# Patient Record
Sex: Female | Born: 1950
Health system: Southern US, Community
[De-identification: ages and names within clinical notes are randomized; demographics above are authoritative.]

## PROBLEM LIST (undated history)

## (undated) DIAGNOSIS — F419 Anxiety disorder, unspecified: Secondary | ICD-10-CM

## (undated) DIAGNOSIS — N3941 Urge incontinence: Secondary | ICD-10-CM

## (undated) DIAGNOSIS — M545 Low back pain, unspecified: Secondary | ICD-10-CM

## (undated) DIAGNOSIS — E7800 Pure hypercholesterolemia, unspecified: Secondary | ICD-10-CM

## (undated) DIAGNOSIS — F32A Depression, unspecified: Secondary | ICD-10-CM

## (undated) DIAGNOSIS — F329 Major depressive disorder, single episode, unspecified: Secondary | ICD-10-CM

## (undated) DIAGNOSIS — K649 Unspecified hemorrhoids: Secondary | ICD-10-CM

## (undated) DIAGNOSIS — L900 Lichen sclerosus et atrophicus: Principal | ICD-10-CM

## (undated) HISTORY — DX: Unspecified hemorrhoids: K64.9

## (undated) HISTORY — DX: Urge incontinence: N39.41

## (undated) HISTORY — DX: Low back pain, unspecified: M54.50

## (undated) HISTORY — PX: TONSILLECTOMY: SUR1361

## (undated) HISTORY — DX: Lichen sclerosus et atrophicus: L90.0

## (undated) HISTORY — DX: Low back pain: M54.5

---

## 1968-04-29 HISTORY — PX: BREAST CYST EXCISION: SHX579

## 1973-04-29 HISTORY — PX: OTHER SURGICAL HISTORY: SHX169

## 1998-04-29 HISTORY — PX: OTHER SURGICAL HISTORY: SHX169

## 1999-04-09 ENCOUNTER — Other Ambulatory Visit: Admission: RE | Admit: 1999-04-09 | Discharge: 1999-04-09 | Payer: Self-pay | Admitting: Obstetrics & Gynecology

## 1999-04-10 ENCOUNTER — Other Ambulatory Visit: Admission: RE | Admit: 1999-04-10 | Discharge: 1999-04-10 | Payer: Self-pay | Admitting: Obstetrics & Gynecology

## 1999-04-10 ENCOUNTER — Encounter (INDEPENDENT_AMBULATORY_CARE_PROVIDER_SITE_OTHER): Payer: Self-pay | Admitting: Specialist

## 1999-04-16 ENCOUNTER — Other Ambulatory Visit: Admission: RE | Admit: 1999-04-16 | Discharge: 1999-04-16 | Payer: Self-pay | Admitting: Obstetrics & Gynecology

## 1999-04-16 ENCOUNTER — Encounter (INDEPENDENT_AMBULATORY_CARE_PROVIDER_SITE_OTHER): Payer: Self-pay | Admitting: Specialist

## 1999-05-22 ENCOUNTER — Encounter (INDEPENDENT_AMBULATORY_CARE_PROVIDER_SITE_OTHER): Payer: Self-pay | Admitting: Specialist

## 1999-05-22 ENCOUNTER — Ambulatory Visit (HOSPITAL_COMMUNITY): Admission: RE | Admit: 1999-05-22 | Discharge: 1999-05-22 | Payer: Self-pay | Admitting: Obstetrics & Gynecology

## 2000-04-16 ENCOUNTER — Other Ambulatory Visit: Admission: RE | Admit: 2000-04-16 | Discharge: 2000-04-16 | Payer: Self-pay | Admitting: Obstetrics & Gynecology

## 2004-12-11 ENCOUNTER — Ambulatory Visit: Payer: Self-pay | Admitting: Internal Medicine

## 2005-12-04 ENCOUNTER — Ambulatory Visit: Payer: Self-pay | Admitting: Internal Medicine

## 2005-12-09 ENCOUNTER — Ambulatory Visit: Payer: Self-pay | Admitting: Internal Medicine

## 2005-12-18 ENCOUNTER — Ambulatory Visit: Payer: Self-pay | Admitting: Internal Medicine

## 2006-12-08 ENCOUNTER — Ambulatory Visit: Payer: Self-pay | Admitting: Internal Medicine

## 2006-12-08 LAB — CONVERTED CEMR LAB
ALT: 13 units/L (ref 0–35)
AST: 19 units/L (ref 0–37)
Alkaline Phosphatase: 89 units/L (ref 39–117)
BUN: 6 mg/dL (ref 6–23)
Basophils Relative: 0.6 % (ref 0.0–1.0)
Bilirubin Urine: NEGATIVE
Bilirubin, Direct: 0.1 mg/dL (ref 0.0–0.3)
CO2: 31 meq/L (ref 19–32)
Calcium: 8.9 mg/dL (ref 8.4–10.5)
Chloride: 106 meq/L (ref 96–112)
Eosinophils Relative: 2.1 % (ref 0.0–5.0)
GFR calc Af Amer: 133 mL/min
Glucose, Bld: 92 mg/dL (ref 70–99)
Ketones, ur: NEGATIVE mg/dL
LDL Cholesterol: 120 mg/dL — ABNORMAL HIGH (ref 0–99)
Monocytes Relative: 7.2 % (ref 3.0–11.0)
Nitrite: NEGATIVE
Platelets: 254 10*3/uL (ref 150–400)
RBC: 4.57 M/uL (ref 3.87–5.11)
RDW: 13.4 % (ref 11.5–14.6)
Specific Gravity, Urine: 1.02 (ref 1.000–1.03)
Total CHOL/HDL Ratio: 4.8
Total Protein, Urine: NEGATIVE mg/dL
Total Protein: 6.6 g/dL (ref 6.0–8.3)
Triglycerides: 115 mg/dL (ref 0–149)
Urine Glucose: NEGATIVE mg/dL
VLDL: 23 mg/dL (ref 0–40)
WBC: 6.3 10*3/uL (ref 4.5–10.5)
pH: 6 (ref 5.0–8.0)

## 2006-12-15 ENCOUNTER — Ambulatory Visit: Payer: Self-pay | Admitting: Internal Medicine

## 2006-12-15 DIAGNOSIS — E739 Lactose intolerance, unspecified: Secondary | ICD-10-CM | POA: Insufficient documentation

## 2006-12-15 DIAGNOSIS — I10 Essential (primary) hypertension: Secondary | ICD-10-CM | POA: Insufficient documentation

## 2006-12-15 DIAGNOSIS — F3289 Other specified depressive episodes: Secondary | ICD-10-CM | POA: Insufficient documentation

## 2006-12-15 DIAGNOSIS — F411 Generalized anxiety disorder: Secondary | ICD-10-CM | POA: Insufficient documentation

## 2006-12-15 DIAGNOSIS — E785 Hyperlipidemia, unspecified: Secondary | ICD-10-CM | POA: Insufficient documentation

## 2006-12-15 DIAGNOSIS — F329 Major depressive disorder, single episode, unspecified: Secondary | ICD-10-CM

## 2006-12-15 DIAGNOSIS — D6489 Other specified anemias: Secondary | ICD-10-CM | POA: Insufficient documentation

## 2006-12-16 ENCOUNTER — Ambulatory Visit: Payer: Self-pay | Admitting: Internal Medicine

## 2007-06-23 ENCOUNTER — Encounter: Payer: Self-pay | Admitting: Internal Medicine

## 2008-01-07 ENCOUNTER — Ambulatory Visit: Payer: Self-pay | Admitting: Internal Medicine

## 2008-01-07 DIAGNOSIS — J309 Allergic rhinitis, unspecified: Secondary | ICD-10-CM | POA: Insufficient documentation

## 2008-01-07 DIAGNOSIS — M810 Age-related osteoporosis without current pathological fracture: Secondary | ICD-10-CM | POA: Insufficient documentation

## 2008-01-07 LAB — CONVERTED CEMR LAB
ALT: 15 units/L (ref 0–35)
Alkaline Phosphatase: 72 units/L (ref 39–117)
Basophils Absolute: 0.1 10*3/uL (ref 0.0–0.1)
Bilirubin Urine: NEGATIVE
Bilirubin, Direct: 0.2 mg/dL (ref 0.0–0.3)
CO2: 30 meq/L (ref 19–32)
Calcium: 9.3 mg/dL (ref 8.4–10.5)
Chloride: 109 meq/L (ref 96–112)
HDL: 37.1 mg/dL — ABNORMAL LOW (ref >39.0)
Ketones, ur: NEGATIVE mg/dL
LDL Cholesterol: 119 mg/dL — ABNORMAL HIGH (ref 0–99)
Lymphocytes Relative: 35.2 % (ref 12.0–46.0)
MCHC: 34.7 g/dL (ref 30.0–36.0)
Monocytes Relative: 8 % (ref 3.0–12.0)
Mucus, UA: NEGATIVE
Neutrophils Relative %: 52.9 % (ref 43.0–77.0)
Nitrite: NEGATIVE
Potassium: 4.3 meq/L (ref 3.5–5.1)
RBC / HPF: NONE SEEN
RDW: 12.7 % (ref 11.5–14.6)
Sodium: 141 meq/L (ref 135–145)
Specific Gravity, Urine: 1.02 (ref 1.000–1.03)
TSH: 2.6 microintl units/mL (ref 0.35–5.50)
Total Bilirubin: 0.8 mg/dL (ref 0.3–1.2)
Total CHOL/HDL Ratio: 4.5
Triglycerides: 51 mg/dL (ref 0–149)
Urobilinogen, UA: 0.2 (ref 0.0–1.0)
pH: 6 (ref 5.0–8.0)

## 2008-06-24 ENCOUNTER — Encounter: Payer: Self-pay | Admitting: Internal Medicine

## 2009-01-04 ENCOUNTER — Ambulatory Visit: Payer: Self-pay | Admitting: Internal Medicine

## 2009-01-04 LAB — CONVERTED CEMR LAB
Alkaline Phosphatase: 57 units/L (ref 39–117)
Basophils Absolute: 0 10*3/uL (ref 0.0–0.1)
Bilirubin, Direct: 0.1 mg/dL (ref 0.0–0.3)
Calcium: 9.5 mg/dL (ref 8.4–10.5)
GFR calc non Af Amer: 77.84 mL/min (ref >60)
HCT: 37.3 % (ref 36.0–46.0)
HDL: 44.3 mg/dL (ref >39.00)
Lymphs Abs: 1.9 10*3/uL (ref 0.7–4.0)
MCV: 87.3 fL (ref 78.0–100.0)
Monocytes Absolute: 0.5 10*3/uL (ref 0.1–1.0)
Monocytes Relative: 8.4 % (ref 3.0–12.0)
Platelets: 213 10*3/uL (ref 150.0–400.0)
RDW: 12.8 % (ref 11.5–14.6)
Sodium: 143 meq/L (ref 135–145)
Specific Gravity, Urine: 1.02 (ref 1.000–1.030)
Total CHOL/HDL Ratio: 4
Total Protein, Urine: NEGATIVE mg/dL
Triglycerides: 71 mg/dL (ref 0.0–149.0)
Urine Glucose: NEGATIVE mg/dL
VLDL: 14.2 mg/dL (ref 0.0–40.0)

## 2009-01-10 ENCOUNTER — Ambulatory Visit: Payer: Self-pay | Admitting: Internal Medicine

## 2009-07-20 ENCOUNTER — Encounter: Payer: Self-pay | Admitting: Internal Medicine

## 2009-12-27 ENCOUNTER — Encounter: Payer: Self-pay | Admitting: Internal Medicine

## 2010-05-29 NOTE — Letter (Signed)
Summary: [facility]  [facility]   Imported By: Cala Bradford Mesiemore 01/10/2010 13:06:41  _____________________________________________________________________  External Attachment:    Type:   Image     Comment:   External Document

## 2010-06-14 ENCOUNTER — Other Ambulatory Visit (HOSPITAL_COMMUNITY): Payer: Self-pay | Admitting: Orthopaedic Surgery

## 2010-06-14 DIAGNOSIS — M25569 Pain in unspecified knee: Secondary | ICD-10-CM

## 2010-06-19 ENCOUNTER — Ambulatory Visit (HOSPITAL_COMMUNITY)
Admission: RE | Admit: 2010-06-19 | Discharge: 2010-06-19 | Disposition: A | Discharge status: Home / Self Care | Payer: 59 | Source: Ambulatory Visit | Attending: Orthopaedic Surgery | Admitting: Orthopaedic Surgery

## 2010-06-19 DIAGNOSIS — M25469 Effusion, unspecified knee: Secondary | ICD-10-CM | POA: Insufficient documentation

## 2010-06-19 DIAGNOSIS — M23329 Other meniscus derangements, posterior horn of medial meniscus, unspecified knee: Secondary | ICD-10-CM | POA: Insufficient documentation

## 2010-06-19 DIAGNOSIS — M25569 Pain in unspecified knee: Secondary | ICD-10-CM | POA: Insufficient documentation

## 2010-06-19 DIAGNOSIS — M25669 Stiffness of unspecified knee, not elsewhere classified: Secondary | ICD-10-CM | POA: Insufficient documentation

## 2010-09-14 NOTE — Op Note (Signed)
[facility]  Patient:    Natasha Rodgers                       MRN: [id number] Proc. Date: 05/22/99 Adm. Date:  [id number] Attending:  Genia Del                           Operative Report  DATE OF BIRTH:                [birth year].  PREOPERATIVE DIAGNOSES:       Menometrorrhagia with anemia, endometrial polyps nd uterine myomas.  POSTOPERATIVE DIAGNOSES:      Menometrorrhagia with anemia, endometrial polyps nd uterine myomas.  INTERVENTION:                 Operative hysteroscopy with polypectomies and dilatation and curettage.  SURGEON:                      Genia Del, M.D.  ANESTHESIA:  ANESTHESIOLOGIST:             Ellison Hughs., M.D.  DESCRIPTION OF PROCEDURE:     Under MAC, the patient is in the lithotomy position. She is prepped with Betadine on the suprapubic, vulvar and vaginal areas.  A bladder catheter is introduced to empty the bladder and then the patient is prepped as usual.  The vaginal exam reveals a slightly increased volume for the uterus,  which is mobile.  No adnexal mass.  The cervix is normal.  The speculum is introduced.  The paracervical block is done with 1% local anesthetic, 22 cc, at 4 oclock, 8 oclock and at the level of the uterosacral ligaments; 1 cc is also injected on the anterior lip of the cervix.  Then the Pozzi clamp is applied on the anterior lip of the cervix and dilatation is done with Hegar dilators up to #35; this is done easily.  Then the hysteroscope is introduced into the uterine cavity. Multiple polyps are seen, a few small ones at the left cornu and two larger ones on the right anterior fundal area.  The loop is used to remove those polyps and they are sent to pathology.  Dilatation and curettage are then done with a sharp curette on all surfaces of the uterine cavity and this specimen is also sent to pathology. A last look is taken with a hysteroscope,  revealing a normal uterine cavity. Pictures are taken.  The hemostasis is adequate after completing with coagulation. The instruments are removed.  The estimated blood loss was 30 cc.  Ins and outs are -80 cc of fluid.  No complication occurred and the patient is sent to recovery oom in good status. DD:  05/22/99 TD:  05/23/99 Job: [id number]

## 2010-10-19 ENCOUNTER — Encounter (HOSPITAL_COMMUNITY)
Admission: RE | Admit: 2010-10-19 | Discharge: 2010-10-19 | Disposition: A | Discharge status: Home / Self Care | Payer: 59 | Source: Ambulatory Visit | Attending: Orthopaedic Surgery | Admitting: Orthopaedic Surgery

## 2010-10-19 LAB — COMPREHENSIVE METABOLIC PANEL
Albumin: 4 g/dL (ref 3.5–5.2)
BUN: 17 mg/dL (ref 6–23)
Creatinine, Ser: 0.63 mg/dL (ref 0.50–1.10)
Potassium: 4.8 mEq/L (ref 3.5–5.1)
Total Protein: 7 g/dL (ref 6.0–8.3)

## 2010-10-19 LAB — URINALYSIS, ROUTINE W REFLEX MICROSCOPIC
Bilirubin Urine: NEGATIVE
Glucose, UA: NEGATIVE mg/dL
Ketones, ur: NEGATIVE mg/dL
Leukocytes, UA: NEGATIVE
Nitrite: NEGATIVE
Specific Gravity, Urine: 1.02 (ref 1.005–1.030)
pH: 7 (ref 5.0–8.0)

## 2010-10-19 LAB — DIFFERENTIAL
Basophils Absolute: 0 10*3/uL (ref 0.0–0.1)
Eosinophils Relative: 2 % (ref 0–5)
Lymphocytes Relative: 30 % (ref 12–46)
Lymphs Abs: 1.8 10*3/uL (ref 0.7–4.0)
Monocytes Absolute: 0.5 10*3/uL (ref 0.1–1.0)
Neutro Abs: 3.4 10*3/uL (ref 1.7–7.7)

## 2010-10-19 LAB — CBC
HCT: 38.2 % (ref 36.0–46.0)
MCHC: 33 g/dL (ref 30.0–36.0)
MCV: 88.8 fL (ref 78.0–100.0)
RDW: 13.1 % (ref 11.5–15.5)

## 2010-10-19 LAB — SURGICAL PCR SCREEN
MRSA, PCR: NEGATIVE
Staphylococcus aureus: NEGATIVE

## 2010-10-25 ENCOUNTER — Ambulatory Visit (HOSPITAL_COMMUNITY)
Admission: RE | Admit: 2010-10-25 | Discharge: 2010-10-25 | Disposition: A | Discharge status: Home / Self Care | Payer: 59 | Source: Ambulatory Visit | Attending: Orthopaedic Surgery | Admitting: Orthopaedic Surgery

## 2010-10-25 ENCOUNTER — Other Ambulatory Visit: Payer: Self-pay | Admitting: Orthopaedic Surgery

## 2010-10-25 DIAGNOSIS — Z01810 Encounter for preprocedural cardiovascular examination: Secondary | ICD-10-CM | POA: Insufficient documentation

## 2010-10-25 DIAGNOSIS — Z01812 Encounter for preprocedural laboratory examination: Secondary | ICD-10-CM | POA: Insufficient documentation

## 2010-10-25 DIAGNOSIS — M23305 Other meniscus derangements, unspecified medial meniscus, unspecified knee: Secondary | ICD-10-CM | POA: Insufficient documentation

## 2010-10-25 NOTE — H&P (Signed)
NAME:  Natasha Rodgers, Natasha Rodgers               ACCOUNT NO.:  [id number]  MEDICAL RECORD NO.:  [id number]  LOCATION:  DAY                           FACILITY:  [facility]  PHYSICIAN:  J. Darreld Mclean, M.D. DATE OF BIRTH:  07/04/1950  DATE OF ADMISSION:  10/16/2010 DATE OF DISCHARGE:  LH                             HISTORY & PHYSICAL   The patient is a 60 year old female with pain and tenderness in her left knee.  She has had pain in her knee on and off since November 2011.  She says it is giving way of her knee, popping, swelling.  She has been on ibuprofen and multiple antiinflammatories without significant help.  She was seen at [facility] before Christmas and after Christmas.  At first saw her in the office on February 15.  At that I was concerned about a tear of the medial meniscus of her left knee.  She underwent MRI of the left knee on February 21 in the hospital.  MRI shows a complete radial tear of the root of the posterior horn of the medial meniscus with some degenerative joint disease changes present as well.  The patient was advised of the findings on February 23 but she decided to hold off any surgeries and it was getting a little better.  I saw her again in March and April and in April, she decided to have surgery after school was over and have it done on July 4.  She now scheduled for arthroscopy of the left knee.  I have gone over the risks and imponderables of the procedure, she appears to understand and agrees to the procedure as outlined.  She has no new trauma.  CURRENT MEDICATIONS: 1. Ibuprofen 800 mg 3 times a day. 2. Citalopram 40 mg once a day. 3. Alendronate 70 mg once a week. 4. Pravastatin 40 mg daily. 5. Centrum silver daily. 6. Aspirin 81 mg daily. 7. Vitamin D3 1000 units twice a day. 8. Fish oil 1000 units 3 times a day.  PAST SURGERIES:  D and C in 2000, left breast cyst 1975.  ALLERGIES:  She denies any allergies.  The patient is divorced.   She does not smoke, does no use alcoholic beverages.  Hypertension runs in the family with her mother and 2 sisters.  Review of systems otherwise negative.  FAMILY HISTORY:  Otherwise negative.  PHYSICAL EXAMINATION:  GENERAL:  The patient is alert, cooperative, oriented. HEENT:  Negative. NECK:  Supple. LUNGS:  Clear to P and A. HEART:  Regular rhythm without murmur heard. EXTREMITIES:  Her left knee has pain and tenderness, mild effusion.  She has got a positive McMurray medially.  Range of motion is 0-100 degrees. Knee is stable.  There is no distal edema.  Other extremities in normal limits. CNS:  Intact. SKIN:  Intact.  IMPRESSION:  Left knee medial meniscal tear.  PLAN:  Arthroscopy of the left knee.  Labs are pending.  ______________________________ Shela Commons. Darreld Mclean, M.D.     JWK/MEDQ  D:  10/22/2010  T:  10/22/2010  Job:  [id number]  Electronically Signed by Darreld Mclean M.D. on 10/25/2010 09:27:03 AM

## 2010-10-28 HISTORY — PX: OTHER SURGICAL HISTORY: SHX169

## 2010-10-29 ENCOUNTER — Ambulatory Visit (HOSPITAL_COMMUNITY): Payer: 59 | Admitting: Physical Therapy

## 2010-10-31 NOTE — Op Note (Signed)
NAME:  Natasha Rodgers, Natasha Rodgers               ACCOUNT NO.:  [id number]  MEDICAL RECORD NO.:  [id number]  LOCATION:                                 FACILITY:  PHYSICIAN:  J. Darreld Mclean, M.D. DATE OF BIRTH:  November 02, 1950  DATE OF PROCEDURE: DATE OF DISCHARGE:                              OPERATIVE REPORT   PREOPERATIVE DIAGNOSIS:  Tear, medial meniscus of the left knee.  POSTOPERATIVE DIAGNOSIS:  Tear, medial meniscus of the left knee.  PROCEDURE:  Operative arthroscopy of the left knee, partial medial meniscectomy.  ANESTHESIA:  General.  TOURNIQUET TIME:  20 minutes.  DRAINS:  No drains.  SURGEON:  J. Darreld Mclean, MD.  INDICATIONS:  The patient is a 60 year old female with pain and tenderness in her knee.  MRI shows tear of the posterior horn of medial meniscus.  She did not improve with conservative treatment, rest, or anti-inflammatories.  Risks and imponderables of the procedure were discussed preoperatively.  The patient appeared to understand and agreed to procedure as outlined.  She asked appropriate questions.  DESCRIPTION OF PROCEDURE:  The patient was seen in the holding area, identified the left knee as the correct surgical site.  She placed a mark on the left knee.  I placed a mark on the left knee.  She was brought to the operating room given general anesthesia while supine. Tourniquet leg holder placed and deflated left upper thigh.  She was prepped and draped in the usual manner.  A generalized time-out, identifying the patient as Natasha Rodgers, her left knee for medial meniscectomy.  The operating room team knew each other. All instrumentation was properly positioned and working.  She was given no preoperative antibiotics.  The leg was elevated and wrapped circumferentially using an Esmarch bandage.  Tourniquet inflated to 300 mmHg.  Esmarch bandage was removed. Inflow cannula was inserted.  The lactated Ringer's instilled in the knee via an infusion pump.   Arthroscope inserted laterally and the knee systematically examined.  Undersurface of the patella had grade 2 changes, but no apparent loose bodies floating in the knee.  The medial femoral condyle had grade 3 changes of the medial tibial plateau.  The posterior horn of the medial meniscus was torn.  Anterior cruciate was intact laterally.  The knee looked good with good appearance of the lateral meniscus.  There is mild grade 2 changes.  Attention was directed back to medial side.  Using meniscal punch and meniscal shaver, good smooth contour was obtained.  Knee was systematically reexamined. No new pathology found.  Knee was irrigated with remainder part of lactated Ringer's.  Wound was reapproximated using 3-0 nylon interrupted vertical mattress manner.  Marcaine 0.25% was instilled in each portal. Tourniquet deflated after 20 minutes.  Sterile dressing applied.  Bulky dressing applied.  The patient was given a prescription for Norco 7.5/325.  I will see her in the office approximately in 10 days. Physical therapy has been arranged.  If any difficulties, contact me through the office hospital beeper system.          ______________________________ Shela Commons. Darreld Mclean, M.D.     JWK/MEDQ  D:  10/25/2010  T:  10/25/2010  Job:  [id number]  Electronically Signed by Darreld Mclean M.D. on 10/31/2010 04:48:09 PM

## 2011-02-26 ENCOUNTER — Other Ambulatory Visit (HOSPITAL_COMMUNITY): Payer: Self-pay | Admitting: Family Medicine

## 2011-02-26 DIAGNOSIS — Z139 Encounter for screening, unspecified: Secondary | ICD-10-CM

## 2011-03-12 ENCOUNTER — Ambulatory Visit (HOSPITAL_COMMUNITY)
Admission: RE | Admit: 2011-03-12 | Discharge: 2011-03-12 | Disposition: A | Discharge status: Home / Self Care | Payer: 59 | Source: Ambulatory Visit | Attending: Family Medicine | Admitting: Family Medicine

## 2011-03-12 DIAGNOSIS — Z1231 Encounter for screening mammogram for malignant neoplasm of breast: Secondary | ICD-10-CM | POA: Insufficient documentation

## 2011-03-12 DIAGNOSIS — Z780 Asymptomatic menopausal state: Secondary | ICD-10-CM | POA: Insufficient documentation

## 2011-03-12 DIAGNOSIS — Z139 Encounter for screening, unspecified: Secondary | ICD-10-CM

## 2011-03-12 DIAGNOSIS — M899 Disorder of bone, unspecified: Secondary | ICD-10-CM | POA: Insufficient documentation

## 2011-03-19 ENCOUNTER — Telehealth: Payer: Self-pay

## 2011-03-19 ENCOUNTER — Other Ambulatory Visit: Payer: Self-pay

## 2011-03-19 DIAGNOSIS — Z139 Encounter for screening, unspecified: Secondary | ICD-10-CM

## 2011-03-19 NOTE — Telephone Encounter (Signed)
Hold calcium for 3 days prior to TCS. MOVI PREP SPLIT DOSING, REGULAR BREAKFAST. CLEAR LIQUIDS AFTER 9 AM.

## 2011-03-19 NOTE — Telephone Encounter (Signed)
Gastroenterology Pre-Procedure Form  Request Date: 03/19/2011        Requesting Physician: Autumn Patty Claggett, PA-C     PATIENT INFORMATION:  Natasha Rodgers is a 60 y.o., female (DOB=1951-01-07).  PROCEDURE: Procedure(s) requested: colonoscopy Procedure Reason: screening for colon cancer  PATIENT REVIEW QUESTIONS: The patient reports the following:   1. Diabetes Melitis: no 2. Joint replacements in the past 12 months: no 3. Major health problems in the past 3 months: no 4. Has an artificial valve or MVP:no 5. Has been advised in past to take antibiotics in advance of a procedure like teeth cleaning: no}    MEDICATIONS & ALLERGIES:    Patient reports the following regarding taking any blood thinners:   Plavix? no Aspirin?yes  Coumadin?  no  Patient confirms/reports the following medications:  Current Outpatient Prescriptions  Medication Sig Dispense Refill  . alendronate (FOSAMAX) 70 MG tablet Take 70 mg by mouth every 7 (seven) days. Take with a full glass of water on an empty stomach.       Marland Kitchen aspirin 81 MG tablet Take 81 mg by mouth daily.        Marland Kitchen buPROPion (WELLBUTRIN XL) 300 MG 24 hr tablet Take 300 mg by mouth daily.        . citalopram (CELEXA) 40 MG tablet Take 40 mg by mouth daily.        . Multiple Vitamins-Minerals (CENTRUM SILVER ULTRA WOMENS PO) Take by mouth once.        . Multiple Vitamins-Minerals (PRESERVISION AREDS PO) Take by mouth 1 day or 1 dose.        . NON FORMULARY Calcium 1200 mg plus D3     One tablet daily       . Omega-3 Fatty Acids (FISH OIL) 1000 MG CAPS Take by mouth 3 (three) times daily.        . pravastatin (PRAVACHOL) 40 MG tablet Take 40 mg by mouth daily.          Patient confirms/reports the following allergies:  No Known Allergies  Patient is appropriate to schedule for requested procedure(s): yes  AUTHORIZATION INFORMATION Primary Insurance:   ID #:   Group #:  Pre-Cert / Auth required: Pre-Cert / Auth #:   Secondary Insurance:  ID  #:   Group #:  Pre-Cert / Auth required: Pre-Cert / Auth #:   No orders of the defined types were placed in this encounter.    SCHEDULE INFORMATION: Procedure has been scheduled as follows:  Date: 04/09/2011       Time: 11:15 AM  Location: [facility] Short Stay  This Gastroenterology Pre-Precedure Form is being routed to the following provider(s) for review: Jonette Eva, MD

## 2011-03-26 NOTE — Telephone Encounter (Signed)
Rx and instructions mailed.  

## 2011-04-03 ENCOUNTER — Encounter (HOSPITAL_COMMUNITY): Payer: Self-pay | Admitting: Pharmacy Technician

## 2011-04-08 MED ORDER — SODIUM CHLORIDE 0.45 % IV SOLN
Freq: Once | INTRAVENOUS | Status: AC
  Administered 2011-04-09: 10:00:00 via INTRAVENOUS

## 2011-04-09 ENCOUNTER — Encounter (HOSPITAL_COMMUNITY)
Admission: RE | Disposition: A | Discharge status: Home / Self Care | Payer: Self-pay | Source: Ambulatory Visit | Attending: Gastroenterology

## 2011-04-09 ENCOUNTER — Ambulatory Visit (HOSPITAL_COMMUNITY)
Admission: RE | Admit: 2011-04-09 | Discharge: 2011-04-09 | Disposition: A | Discharge status: Home / Self Care | Payer: 59 | Source: Ambulatory Visit | Attending: Gastroenterology | Admitting: Gastroenterology

## 2011-04-09 ENCOUNTER — Other Ambulatory Visit: Payer: Self-pay | Admitting: Gastroenterology

## 2011-04-09 ENCOUNTER — Encounter (HOSPITAL_COMMUNITY): Payer: Self-pay | Admitting: *Deleted

## 2011-04-09 DIAGNOSIS — Z8371 Family history of colonic polyps: Secondary | ICD-10-CM | POA: Insufficient documentation

## 2011-04-09 DIAGNOSIS — K648 Other hemorrhoids: Secondary | ICD-10-CM

## 2011-04-09 DIAGNOSIS — Z7982 Long term (current) use of aspirin: Secondary | ICD-10-CM | POA: Insufficient documentation

## 2011-04-09 DIAGNOSIS — Z1211 Encounter for screening for malignant neoplasm of colon: Secondary | ICD-10-CM | POA: Insufficient documentation

## 2011-04-09 DIAGNOSIS — D126 Benign neoplasm of colon, unspecified: Secondary | ICD-10-CM

## 2011-04-09 DIAGNOSIS — Z83719 Family history of colon polyps, unspecified: Secondary | ICD-10-CM | POA: Insufficient documentation

## 2011-04-09 DIAGNOSIS — Z139 Encounter for screening, unspecified: Secondary | ICD-10-CM

## 2011-04-09 HISTORY — DX: Depression, unspecified: F32.A

## 2011-04-09 HISTORY — DX: Anxiety disorder, unspecified: F41.9

## 2011-04-09 HISTORY — PX: COLONOSCOPY: SHX5424

## 2011-04-09 HISTORY — DX: Pure hypercholesterolemia, unspecified: E78.00

## 2011-04-09 HISTORY — DX: Major depressive disorder, single episode, unspecified: F32.9

## 2011-04-09 SURGERY — COLONOSCOPY
Anesthesia: Moderate Sedation

## 2011-04-09 MED ORDER — MIDAZOLAM HCL 5 MG/5ML IJ SOLN
INTRAMUSCULAR | Status: AC
  Filled 2011-04-09: qty 130

## 2011-04-09 MED ORDER — MEPERIDINE HCL 100 MG/ML IJ SOLN
INTRAMUSCULAR | Status: AC
  Filled 2011-04-09: qty 2

## 2011-04-09 MED ORDER — STERILE WATER FOR IRRIGATION IR SOLN
Status: DC | PRN
  Administered 2011-04-09: 11:00:00

## 2011-04-09 MED ORDER — MIDAZOLAM HCL 5 MG/5ML IJ SOLN
INTRAMUSCULAR | Status: DC | PRN
  Administered 2011-04-09 (×2): 2 mg via INTRAVENOUS

## 2011-04-09 MED ORDER — MEPERIDINE HCL 100 MG/ML IJ SOLN
INTRAMUSCULAR | Status: DC | PRN
  Administered 2011-04-09: 50 mg via INTRAVENOUS
  Administered 2011-04-09: 25 mg via INTRAVENOUS

## 2011-04-09 NOTE — H&P (Addendum)
  Primary Care Physician:  Rush Barer, PA, PA-C Primary Gastroenterologist:  Dr. Darrick Penna  Pre-Procedure History & Physical: HPI:  Natasha Rodgers is a 60 y.o. female here for AVERAGE RISK SCREENING. MOTHER HAD COLON POLYPS AFTER THE AGE OF 60.  No past medical history on file.  No past surgical history on file.  Prior to Admission medications   Medication Sig Start Date End Date Taking? Authorizing Provider  acetaminophen (TYLENOL) 650 MG CR tablet Take 650 mg by mouth every 8 (eight) hours as needed. For pain    Yes Historical Provider, MD  aspirin EC 81 MG tablet Take 81 mg by mouth daily.     Yes Historical Provider, MD  calcium-vitamin D (OSCAL WITH D) 500-200 MG-UNIT per tablet Take 1 tablet by mouth daily.     Yes Historical Provider, MD  Glycerin-Hypromellose-PEG 400 (VISINE TEARS OP) Apply 1-2 drops to eye as needed. For dry eyes    Yes Historical Provider, MD  alendronate (FOSAMAX) 70 MG tablet Take 70 mg by mouth every 7 (seven) days. Take with a full glass of water on an empty stomach.     Historical Provider, MD  buPROPion (WELLBUTRIN XL) 300 MG 24 hr tablet Take 300 mg by mouth daily.      Historical Provider, MD  citalopram (CELEXA) 40 MG tablet Take 40 mg by mouth daily.      Historical Provider, MD  Multiple Vitamins-Minerals (CENTRUM SILVER ULTRA WOMENS PO) Take 1 tablet by mouth daily.     Historical Provider, MD  Multiple Vitamins-Minerals (PRESERVISION AREDS PO) Take 1 tablet by mouth 4 (four) times daily.     Historical Provider, MD  Omega-3 Fatty Acids (FISH OIL) 1000 MG CAPS Take 1 capsule by mouth 3 (three) times daily.     Historical Provider, MD  pravastatin (PRAVACHOL) 40 MG tablet Take 40 mg by mouth daily.      Historical Provider, MD    Allergies as of 03/19/2011  . (No Known Allergies)    No family history on file.  History   Social History  . Marital Status: Divorced    Spouse Name: N/A    Number of Children: N/A  . Years of Education: N/A    Occupational History  . Not on file.   Social History Main Topics  . Smoking status: Not on file  . Smokeless tobacco: Not on file  . Alcohol Use: Not on file  . Drug Use: Not on file  . Sexually Active: Not on file   Other Topics Concern  . Not on file   Social History Narrative  . No narrative on file    Review of Systems: See HPI, otherwise negative ROS   Physical Exam: There were no vitals taken for this visit. General:   Alert,  pleasant and cooperative in NAD Head:  Normocephalic and atraumatic. Neck:  Supple;  Lungs:  Clear throughout to auscultation.    Heart:  Regular rate and rhythm. Abdomen:  Soft, nontender and nondistended. Normal bowel sounds, without guarding, and without rebound.   Neurologic:  Alert and  oriented x4;  grossly normal neurologically.  Impression/Plan:    AVERAGE RISK  PLAN: TCS TODAY

## 2011-04-09 NOTE — Op Note (Signed)
[facility] [address] [place], [place]  [address]  COLONOSCOPY PROCEDURE REPORT  PATIENT:  Natasha, Rodgers  MR#:  [id number] BIRTHDATE:  1951/03/07, 59 yrs. old  GENDER:  female  ENDOSCOPIST:  Jonette Eva, MD REF. BY:  ELIN CLAGGETT, PA-C ASSISTANT:  PROCEDURE DATE:  04/09/2011 PROCEDURE:  Colonoscopy with snare polypectomy  INDICATIONS:  AVERAGE RISK SCREENING  MEDICATIONS:   Demerol 75 mg IV, Versed 4 mg IV  DESCRIPTION OF PROCEDURE:    Physical exam was performed. Informed consent was obtained from the patient after explaining the benefits, risks, and alternatives to procedure.  The patient was connected to monitor and placed in left lateral position. Continuous oxygen was provided by nasal cannula and IV medicine administered through an indwelling cannula.  After administration of sedation and rectal exam, the patient's rectum was intubated and the EC-3890Li ([id number]) colonoscope was advanced under direct visualization to the cecum.  The scope was removed slowly by carefully examining the color, texture, anatomy, and integrity mucosa on the way out.  The patient was recovered in endoscopy and discharged home in satisfactory condition. <<PROCEDUREIMAGES>>  FINDINGS:  A 6 MM  Sessile polyp was found in the sigmoid colon  REMOVED VIA SNARE CAUTERY.  SMALL Internal Hemorrhoids were found.  PREP QUALITY: EXCELLENT CECAL W/D TIME:     12 minutes  COMPLICATIONS:    None  ENDOSCOPIC IMPRESSION: 1) Sessile polyp in the sigmoid colon  RECOMMENDATIONS: TCS IN 10 YEARS HIGH FIBER DIET AWAIT BIOPSIES  REPEAT EXAM:  No  ______________________________ Jonette Eva, MD  CC:  n. eSIGNED:   Rodney Yera at 04/09/2011 11:21 AM  Durwin Nora, [phone]

## 2011-04-11 ENCOUNTER — Telehealth: Payer: Self-pay | Admitting: Gastroenterology

## 2011-04-11 NOTE — Telephone Encounter (Signed)
Results Cc to PCP  

## 2011-04-11 NOTE — Telephone Encounter (Signed)
Please call pt. She had AN INFLAMMATORY POLYP removed from her colon. TCS in 10 years. High fiber diet.

## 2011-04-12 NOTE — Telephone Encounter (Signed)
Pt informed

## 2011-04-17 ENCOUNTER — Encounter (HOSPITAL_COMMUNITY): Payer: Self-pay | Admitting: Gastroenterology

## 2012-03-12 ENCOUNTER — Other Ambulatory Visit (HOSPITAL_COMMUNITY): Payer: Self-pay | Admitting: Family Medicine

## 2012-03-12 DIAGNOSIS — Z139 Encounter for screening, unspecified: Secondary | ICD-10-CM

## 2012-03-19 ENCOUNTER — Ambulatory Visit (HOSPITAL_COMMUNITY)
Admission: RE | Admit: 2012-03-19 | Discharge: 2012-03-19 | Disposition: A | Discharge status: Home / Self Care | Payer: 59 | Source: Ambulatory Visit | Attending: Family Medicine | Admitting: Family Medicine

## 2012-03-19 DIAGNOSIS — Z1231 Encounter for screening mammogram for malignant neoplasm of breast: Secondary | ICD-10-CM | POA: Insufficient documentation

## 2012-03-19 DIAGNOSIS — Z139 Encounter for screening, unspecified: Secondary | ICD-10-CM

## 2013-05-04 ENCOUNTER — Other Ambulatory Visit (HOSPITAL_COMMUNITY): Payer: Self-pay | Admitting: Family Medicine

## 2013-05-04 DIAGNOSIS — Z139 Encounter for screening, unspecified: Secondary | ICD-10-CM

## 2013-05-06 ENCOUNTER — Ambulatory Visit (HOSPITAL_COMMUNITY)
Admission: RE | Admit: 2013-05-06 | Discharge: 2013-05-06 | Disposition: A | Discharge status: Home / Self Care | Payer: 59 | Source: Ambulatory Visit | Attending: Family Medicine | Admitting: Family Medicine

## 2013-05-06 DIAGNOSIS — Z139 Encounter for screening, unspecified: Secondary | ICD-10-CM

## 2013-05-06 DIAGNOSIS — Z1231 Encounter for screening mammogram for malignant neoplasm of breast: Secondary | ICD-10-CM | POA: Insufficient documentation

## 2014-06-02 ENCOUNTER — Other Ambulatory Visit (HOSPITAL_COMMUNITY): Payer: Self-pay | Admitting: Family Medicine

## 2014-06-02 DIAGNOSIS — Z1231 Encounter for screening mammogram for malignant neoplasm of breast: Secondary | ICD-10-CM

## 2014-06-06 ENCOUNTER — Ambulatory Visit (HOSPITAL_COMMUNITY)
Admission: RE | Admit: 2014-06-06 | Discharge: 2014-06-06 | Disposition: A | Discharge status: Home / Self Care | Payer: 59 | Source: Ambulatory Visit | Attending: Family Medicine | Admitting: Family Medicine

## 2014-06-06 DIAGNOSIS — Z1231 Encounter for screening mammogram for malignant neoplasm of breast: Secondary | ICD-10-CM | POA: Diagnosis not present

## 2015-03-09 ENCOUNTER — Encounter: Payer: Self-pay | Admitting: *Deleted

## 2015-03-13 ENCOUNTER — Ambulatory Visit (INDEPENDENT_AMBULATORY_CARE_PROVIDER_SITE_OTHER): Payer: 59 | Admitting: Adult Health

## 2015-03-13 ENCOUNTER — Encounter: Payer: Self-pay | Admitting: Adult Health

## 2015-03-13 VITALS — BP 128/78 | HR 56 | Ht 65.0 in | Wt 201.0 lb

## 2015-03-13 DIAGNOSIS — N3941 Urge incontinence: Secondary | ICD-10-CM | POA: Diagnosis not present

## 2015-03-13 DIAGNOSIS — N952 Postmenopausal atrophic vaginitis: Secondary | ICD-10-CM | POA: Diagnosis not present

## 2015-03-13 DIAGNOSIS — K649 Unspecified hemorrhoids: Secondary | ICD-10-CM

## 2015-03-13 DIAGNOSIS — Z1212 Encounter for screening for malignant neoplasm of rectum: Secondary | ICD-10-CM

## 2015-03-13 DIAGNOSIS — L900 Lichen sclerosus et atrophicus: Secondary | ICD-10-CM | POA: Diagnosis not present

## 2015-03-13 HISTORY — DX: Lichen sclerosus et atrophicus: L90.0

## 2015-03-13 HISTORY — DX: Unspecified hemorrhoids: K64.9

## 2015-03-13 HISTORY — DX: Urge incontinence: N39.41

## 2015-03-13 LAB — HEMOCCULT GUIAC POC 1CARD (OFFICE): FECAL OCCULT BLD: NEGATIVE

## 2015-03-13 MED ORDER — MIRABEGRON ER 25 MG PO TB24: 25.0000 mg | ORAL_TABLET | Freq: Every day | ORAL | Status: DC

## 2015-03-13 MED ORDER — CLOBETASOL PROPIONATE 0.05 % EX CREA: TOPICAL_CREAM | CUTANEOUS | Status: AC

## 2015-03-13 NOTE — Patient Instructions (Signed)
Use temovats 2 x daily for 2 weeks then 2-3 x weekly Take myrbetriq daily Follow up in 4 weeks  Urinary Incontinence Urinary incontinence is the involuntary loss of urine from your bladder. CAUSES  There are many causes of urinary incontinence. They include:  Medicines.  Infections.  Prostatic enlargement, leading to overflow of urine from your bladder.  Surgery.  Neurological diseases.  Emotional factors. SIGNS AND SYMPTOMS Urinary Incontinence can be divided into four types: 1. Urge incontinence. Urge incontinence is the involuntary loss of urine before you have the opportunity to go to the bathroom. There is a sudden urge to void but not enough time to reach a bathroom. 2. Stress incontinence. Stress incontinence is the sudden loss of urine with any activity that forces urine to pass. It is commonly caused by anatomical changes to the pelvis and sphincter areas of your body. 3. Overflow incontinence. Overflow incontinence is the loss of urine from an obstructed opening to your bladder. This results in a backup of urine and a resultant buildup of pressure within the bladder. When the pressure within the bladder exceeds the closing pressure of the sphincter, the urine overflows, which causes incontinence, similar to water overflowing a dam. 4. Total incontinence. Total incontinence is the loss of urine as a result of the inability to store urine within your bladder. DIAGNOSIS  Evaluating the cause of incontinence may require:  A thorough and complete medical and obstetric history.  A complete physical exam.  Laboratory tests such as a urine culture and sensitivities. When additional tests are indicated, they can include:  An ultrasound exam.  Kidney and bladder X-rays.  Cystoscopy. This is an exam of the bladder using a narrow scope.  Urodynamic testing to test the nerve function to the bladder and sphincter areas. TREATMENT  Treatment for urinary incontinence depends on  the cause:  For urge incontinence caused by a bacterial infection, antibiotics will be prescribed. If the urge incontinence is related to medicines you take, your health care provider may have you change the medicine.  For stress incontinence, surgery to re-establish anatomical support to the bladder or sphincter, or both, will often correct the condition.  For overflow incontinence caused by an enlarged prostate, an operation to open the channel through the enlarged prostate will allow the flow of urine out of the bladder. In women with fibroids, a hysterectomy may be recommended.  For total incontinence, surgery on your urinary sphincter may help. An artificial urinary sphincter (an inflatable cuff placed around the urethra) may be required. In women who have developed a hole-like passage between their bladder and vagina (vesicovaginal fistula), surgery to close the fistula often is required. HOME CARE INSTRUCTIONS  Normal daily hygiene and the use of pads or adult diapers that are changed regularly will help prevent odors and skin damage.  Avoid caffeine. It can overstimulate your bladder.  Use the bathroom regularly. Try about every 2-3 hours to go to the bathroom, even if you do not feel the need to do so. Take time to empty your bladder completely. After urinating, wait a minute. Then try to urinate again.  For causes involving nerve dysfunction, keep a log of the medicines you take and a journal of the times you go to the bathroom. SEEK MEDICAL CARE IF:  You experience worsening of pain instead of improvement in pain after your procedure.  Your incontinence becomes worse instead of better. SEE IMMEDIATE MEDICAL CARE IF:  You experience fever or shaking chills.  You  are unable to pass your urine.  You have redness spreading into your groin or down into your thighs. MAKE SURE YOU:   Understand these instructions.   Will watch your condition.  Will get help right away if you  are not doing well or get worse.   This information is not intended to replace advice given to you by your health care provider. Make sure you discuss any questions you have with your health care provider.   Document Released: 05/23/2004 Document Revised: 05/06/2014 Document Reviewed: 09/22/2012 Elsevier Interactive Patient Education [employer].

## 2015-03-13 NOTE — Progress Notes (Signed)
Subjective:     Patient ID: Natasha Rodgers, female   DOB: 1950-07-30, 64 y.o.   MRN: [id number]  HPI Natasha Rodgers is a 64 year old white female,G2P2, referred from [facility],for lesion in peri area.She works second shift and says she has urine leakage in am and is up at night 2-3 times and has urge incontinence if goes not get to bathroom in time.She says there is a sore spot in vagina area for about 6 weeks and burns when she pees.   Review of Systems Patient denies any headaches, hearing loss, fatigue, blurred vision, shortness of breath, chest pain, abdominal pain, problems with bowel movements, or intercourse(no sex since 1976). No joint pain or mood swings.See HPI for positives. Reviewed past medical,surgical, social and family history. Reviewed medications and allergies.     Objective:   Physical Exam BP 128/78 mmHg  Pulse 56  Ht 5\' 5"  (1.651 m)  Wt 201 lb (91.173 kg)  BMI 33.45 kg/m2   Skin warm and dry.Pelvic: external genitalia is normal in appearance for age, vagina: is pale with decreased color, moisture and rugae,there is some whitish color of skin and tearing of tissue when stretched at introitus,urethra has no lesions or masses noted, cervix:smooth and atrophic, uterus: normal size, shape and contour, non tender, no masses felt, adnexa: no masses or tenderness noted. Bladder is non tender and no masses felt. She has ?external hemorrhoid remnant and small area of irritation,on rectal exam she has good tone, no masses felt and hemoccult was negative.She can't void,will give cup and get her to bring urine back at next visit.  Assessment:     Lichen sclerosus Urge incontinence Vaginal atrophy Hemorrhoid     Plan:     Rx temovate 0.05% cream use 2x daily for 2 weeks then 2-3 x weekly with 2 refills Rx myrbetriq 25 mg #30 take 1 daily with 3 refills Follow up in 4 weeks, if area not better may need to get biopsy  Review handout on UI

## 2015-03-20 ENCOUNTER — Telehealth: Payer: Self-pay | Admitting: *Deleted

## 2015-03-20 MED ORDER — OXYBUTYNIN CHLORIDE ER 10 MG PO TB24: 10.0000 mg | ORAL_TABLET | Freq: Every day | ORAL | Status: DC

## 2015-03-20 NOTE — Telephone Encounter (Signed)
Left message will rx ditropan

## 2015-03-20 NOTE — Telephone Encounter (Signed)
I called pt to let her know insurance denied covering the Myrbetriq. Pt states she got 1 month supply for free and really didn't see that it helped much. Pt is wanting to try another med. Thanks!! [place]

## 2015-04-10 ENCOUNTER — Encounter: Payer: Self-pay | Admitting: Adult Health

## 2015-04-10 ENCOUNTER — Ambulatory Visit (INDEPENDENT_AMBULATORY_CARE_PROVIDER_SITE_OTHER): Payer: Managed Care, Other (non HMO) | Admitting: Adult Health

## 2015-04-10 VITALS — BP 138/80 | HR 58 | Ht 66.0 in | Wt 208.5 lb

## 2015-04-10 DIAGNOSIS — N3941 Urge incontinence: Secondary | ICD-10-CM | POA: Diagnosis not present

## 2015-04-10 DIAGNOSIS — L900 Lichen sclerosus et atrophicus: Secondary | ICD-10-CM

## 2015-04-10 NOTE — Progress Notes (Signed)
Subjective:     Patient ID: Natasha Rodgers, female   DOB: 11/06/1950, 64 y.o.   MRN: [id number]  HPI Natasha Rodgers is a 64 year old white female in for follow up on using temovate for lichen sclerosus, and is not taking any meds for UI no,they did not help.  Review of Systems Patient denies any headaches, hearing loss, fatigue, blurred vision, shortness of breath, chest pain, abdominal pain, problems with bowel movements, or intercourse. No joint pain or mood swings.Still UI. Reviewed past medical,surgical, social and family history. Reviewed medications and allergies.     Objective:   Physical Exam BP 138/80 mmHg  Pulse 58  Ht 5\' 6"  (1.676 m)  Wt 208 lb 8 oz (94.575 kg)  BMI 33.67 kg/m2    Skin warm and dry, tissues at introitus is no longer white and no tears noted, tissue is pink, she said myrbetriq and ditropan(had headache with it) did not help with bladder issues, will refer to urologist but she wants to wait, 9 year old son committed suicide Saturday and she is busy with that right now.  Assessment:     Lichen sclerosus  Urge incontinence     Plan:    Will refer to urologist when she is ready Continue temovate 2-3 x weekly  Follow up in 3 months or before if needed

## 2015-04-10 NOTE — Patient Instructions (Signed)
Continue to use temovate cream 2-3 x weekly  Follow up in 3 months or before if needed

## 2015-07-10 ENCOUNTER — Other Ambulatory Visit: Payer: Managed Care, Other (non HMO) | Admitting: Adult Health

## 2015-07-11 ENCOUNTER — Other Ambulatory Visit (HOSPITAL_COMMUNITY)
Admission: RE | Admit: 2015-07-11 | Discharge: 2015-07-11 | Disposition: A | Discharge status: Home / Self Care | Payer: Medicare Other | Source: Ambulatory Visit | Attending: Adult Health | Admitting: Adult Health

## 2015-07-11 ENCOUNTER — Ambulatory Visit (INDEPENDENT_AMBULATORY_CARE_PROVIDER_SITE_OTHER): Payer: Medicare Other | Admitting: Adult Health

## 2015-07-11 ENCOUNTER — Encounter: Payer: Self-pay | Admitting: Adult Health

## 2015-07-11 VITALS — BP 130/82 | HR 64 | Ht 65.0 in | Wt 210.5 lb

## 2015-07-11 DIAGNOSIS — Z01419 Encounter for gynecological examination (general) (routine) without abnormal findings: Secondary | ICD-10-CM | POA: Diagnosis not present

## 2015-07-11 DIAGNOSIS — Z1151 Encounter for screening for human papillomavirus (HPV): Secondary | ICD-10-CM | POA: Diagnosis present

## 2015-07-11 DIAGNOSIS — Z1212 Encounter for screening for malignant neoplasm of rectum: Secondary | ICD-10-CM | POA: Diagnosis not present

## 2015-07-11 DIAGNOSIS — N952 Postmenopausal atrophic vaginitis: Secondary | ICD-10-CM

## 2015-07-11 DIAGNOSIS — Z124 Encounter for screening for malignant neoplasm of cervix: Secondary | ICD-10-CM

## 2015-07-11 DIAGNOSIS — L900 Lichen sclerosus et atrophicus: Secondary | ICD-10-CM

## 2015-07-11 LAB — HEMOCCULT GUIAC POC 1CARD (OFFICE): FECAL OCCULT BLD: NEGATIVE

## 2015-07-11 NOTE — Progress Notes (Signed)
Patient ID: Natasha Rodgers, female   DOB: 07/15/1950, 65 y.o.   MRN: [id number] History of Present Illness:  Natasha Rodgers is a 65 year old white female, in for well woman gyn exam and pap.She says bladder is better,no incontinence, she thinks it may be related to her nerves, which she says are better. PCP is [facility].  Current Medications, Allergies, Past Medical History, Past Surgical History, Family History and Social History were reviewed in [employer] record.     Review of Systems: Patient denies any headaches, hearing loss, fatigue, blurred vision, shortness of breath, chest pain, abdominal pain, problems with bowel movements,or intercourse(not having sex) No joint pain or mood swings.See HPI for positives.    Physical Exam: General:  Well developed, well nourished, no acute distress Skin:  Warm and dry Neck:  Midline trachea, normal thyroid, good ROM, no lymphadenopathy, no carotid bruits heard Lungs; Clear to auscultation bilaterally Breast:  No dominant palpable mass, retraction, or nipple discharge Cardiovascular: Regular rate and rhythm Abdomen:  Soft, non tender, no hepatosplenomegaly Pelvic:  External genitalia is normal in appearance, no lesions.  The vagina has decreased color, moisture and rugae. Urethra has no lesions or masses. The cervix is atrophic, pap with HPV performed.  Uterus is felt to be normal size, shape, and contour.  No adnexal masses or tenderness noted.Bladder is non tender, no masses felt. Rectal: Good sphincter tone, no polyps, internal  hemorrhoids felt.  Hemoccult negative. Extremities/musculoskeletal:  No swelling or varicosities noted, no clubbing or cyanosis Psych:  No mood changes, alert and cooperative,seems happy   Impression: Well woman gyn exam and pap Vaginal atrophy Lichen sclerosus     Plan: Physical in 2 years, can stop paps if this one is normal Labs with PCP Mammogram now and yearly Colonoscopy per GI  Continue  using temovate 2-3 x weekly

## 2015-07-11 NOTE — Patient Instructions (Addendum)
Physical in 2 years Mammogram yearly Labs with PCP Colonoscopy per GI Continue temovate

## 2015-07-13 LAB — CYTOLOGY - PAP

## 2015-08-07 ENCOUNTER — Other Ambulatory Visit (HOSPITAL_COMMUNITY): Payer: Self-pay | Admitting: Physician Assistant

## 2015-08-07 DIAGNOSIS — Z1231 Encounter for screening mammogram for malignant neoplasm of breast: Secondary | ICD-10-CM

## 2015-08-14 ENCOUNTER — Ambulatory Visit (HOSPITAL_COMMUNITY)
Admission: RE | Admit: 2015-08-14 | Discharge: 2015-08-14 | Disposition: A | Discharge status: Home / Self Care | Payer: Medicare Other | Source: Ambulatory Visit | Attending: Physician Assistant | Admitting: Physician Assistant

## 2015-08-14 DIAGNOSIS — Z1231 Encounter for screening mammogram for malignant neoplasm of breast: Secondary | ICD-10-CM | POA: Insufficient documentation

## 2016-09-20 ENCOUNTER — Other Ambulatory Visit (HOSPITAL_COMMUNITY): Payer: Self-pay | Admitting: Physician Assistant

## 2016-09-20 DIAGNOSIS — Z1231 Encounter for screening mammogram for malignant neoplasm of breast: Secondary | ICD-10-CM

## 2016-09-27 ENCOUNTER — Ambulatory Visit (HOSPITAL_COMMUNITY)
Admission: RE | Admit: 2016-09-27 | Discharge: 2016-09-27 | Disposition: A | Discharge status: Home / Self Care | Payer: Medicare Other | Source: Ambulatory Visit | Attending: Physician Assistant | Admitting: Physician Assistant

## 2016-09-27 DIAGNOSIS — Z1231 Encounter for screening mammogram for malignant neoplasm of breast: Secondary | ICD-10-CM | POA: Diagnosis present

## 2017-10-28 ENCOUNTER — Other Ambulatory Visit (HOSPITAL_COMMUNITY): Payer: Self-pay | Admitting: Family Medicine

## 2017-10-28 DIAGNOSIS — Z1231 Encounter for screening mammogram for malignant neoplasm of breast: Secondary | ICD-10-CM

## 2017-11-05 ENCOUNTER — Encounter (HOSPITAL_COMMUNITY): Payer: Self-pay

## 2017-11-05 ENCOUNTER — Ambulatory Visit (HOSPITAL_COMMUNITY)
Admission: RE | Admit: 2017-11-05 | Discharge: 2017-11-05 | Disposition: A | Discharge status: Home / Self Care | Payer: Medicare Other | Source: Ambulatory Visit | Attending: Family Medicine | Admitting: Family Medicine

## 2017-11-05 DIAGNOSIS — Z1231 Encounter for screening mammogram for malignant neoplasm of breast: Secondary | ICD-10-CM | POA: Diagnosis present

## 2019-01-06 ENCOUNTER — Other Ambulatory Visit (HOSPITAL_COMMUNITY): Payer: Self-pay | Admitting: Family Medicine

## 2019-01-06 DIAGNOSIS — Z1231 Encounter for screening mammogram for malignant neoplasm of breast: Secondary | ICD-10-CM

## 2019-01-18 ENCOUNTER — Ambulatory Visit (HOSPITAL_COMMUNITY)
Admission: RE | Admit: 2019-01-18 | Discharge: 2019-01-18 | Disposition: A | Discharge status: Home / Self Care | Payer: Medicare Other | Source: Ambulatory Visit | Attending: Family Medicine | Admitting: Family Medicine

## 2019-01-18 ENCOUNTER — Other Ambulatory Visit: Payer: Self-pay

## 2019-01-18 DIAGNOSIS — Z1231 Encounter for screening mammogram for malignant neoplasm of breast: Secondary | ICD-10-CM

## 2020-01-11 LAB — COLOGUARD: COLOGUARD: NEGATIVE

## 2020-01-11 LAB — EXTERNAL GENERIC LAB PROCEDURE: COLOGUARD: NEGATIVE

## 2020-01-13 ENCOUNTER — Other Ambulatory Visit (HOSPITAL_COMMUNITY): Payer: Self-pay | Admitting: Family Medicine

## 2020-01-13 DIAGNOSIS — Z1231 Encounter for screening mammogram for malignant neoplasm of breast: Secondary | ICD-10-CM

## 2020-01-24 ENCOUNTER — Other Ambulatory Visit: Payer: Self-pay

## 2020-01-24 ENCOUNTER — Ambulatory Visit (HOSPITAL_COMMUNITY)
Admission: RE | Admit: 2020-01-24 | Discharge: 2020-01-24 | Disposition: A | Discharge status: Home / Self Care | Payer: Medicare Other | Source: Ambulatory Visit | Attending: Family Medicine | Admitting: Family Medicine

## 2020-01-24 DIAGNOSIS — Z1231 Encounter for screening mammogram for malignant neoplasm of breast: Secondary | ICD-10-CM | POA: Insufficient documentation

## 2020-06-19 ENCOUNTER — Encounter: Payer: Self-pay | Admitting: Internal Medicine

## 2021-02-12 ENCOUNTER — Other Ambulatory Visit (HOSPITAL_COMMUNITY): Payer: Self-pay | Admitting: Family Medicine

## 2021-02-12 DIAGNOSIS — Z1231 Encounter for screening mammogram for malignant neoplasm of breast: Secondary | ICD-10-CM

## 2021-02-14 ENCOUNTER — Other Ambulatory Visit: Payer: Self-pay

## 2021-02-14 ENCOUNTER — Ambulatory Visit (HOSPITAL_COMMUNITY)
Admission: RE | Admit: 2021-02-14 | Discharge: 2021-02-14 | Disposition: A | Discharge status: Home / Self Care | Payer: Medicare Other | Source: Ambulatory Visit | Attending: Family Medicine | Admitting: Family Medicine

## 2021-02-14 DIAGNOSIS — Z1231 Encounter for screening mammogram for malignant neoplasm of breast: Secondary | ICD-10-CM | POA: Diagnosis present

## 2021-07-08 ENCOUNTER — Encounter (HOSPITAL_COMMUNITY): Payer: Self-pay | Admitting: *Deleted

## 2021-07-08 ENCOUNTER — Emergency Department (HOSPITAL_COMMUNITY)
Admission: EM | Admit: 2021-07-08 | Discharge: 2021-07-08 | Disposition: A | Discharge status: Home / Self Care | Payer: Medicare Other | Attending: Emergency Medicine | Admitting: Emergency Medicine

## 2021-07-08 ENCOUNTER — Other Ambulatory Visit: Payer: Self-pay

## 2021-07-08 DIAGNOSIS — D72829 Elevated white blood cell count, unspecified: Secondary | ICD-10-CM | POA: Diagnosis not present

## 2021-07-08 DIAGNOSIS — R1110 Vomiting, unspecified: Secondary | ICD-10-CM | POA: Diagnosis present

## 2021-07-08 DIAGNOSIS — Z7982 Long term (current) use of aspirin: Secondary | ICD-10-CM | POA: Insufficient documentation

## 2021-07-08 DIAGNOSIS — K529 Noninfective gastroenteritis and colitis, unspecified: Secondary | ICD-10-CM | POA: Insufficient documentation

## 2021-07-08 DIAGNOSIS — R739 Hyperglycemia, unspecified: Secondary | ICD-10-CM | POA: Insufficient documentation

## 2021-07-08 DIAGNOSIS — Z20822 Contact with and (suspected) exposure to covid-19: Secondary | ICD-10-CM | POA: Insufficient documentation

## 2021-07-08 DIAGNOSIS — E860 Dehydration: Secondary | ICD-10-CM | POA: Insufficient documentation

## 2021-07-08 LAB — URINALYSIS, ROUTINE W REFLEX MICROSCOPIC
Bilirubin Urine: NEGATIVE
Glucose, UA: NEGATIVE mg/dL
Ketones, ur: 5 mg/dL — AB
Nitrite: NEGATIVE
Protein, ur: 30 mg/dL — AB
Specific Gravity, Urine: 1.025 (ref 1.005–1.030)
pH: 5 (ref 5.0–8.0)

## 2021-07-08 LAB — CBC WITH DIFFERENTIAL/PLATELET
Abs Immature Granulocytes: 0.06 10*3/uL (ref 0.00–0.07)
Basophils Absolute: 0 10*3/uL (ref 0.0–0.1)
Basophils Relative: 0 %
Eosinophils Absolute: 0 10*3/uL (ref 0.0–0.5)
Eosinophils Relative: 0 %
HCT: 47.9 % — ABNORMAL HIGH (ref 36.0–46.0)
Hemoglobin: 15.4 g/dL — ABNORMAL HIGH (ref 12.0–15.0)
Immature Granulocytes: 1 %
Lymphocytes Relative: 2 %
Lymphs Abs: 0.3 10*3/uL — ABNORMAL LOW (ref 0.7–4.0)
MCH: 27.7 pg (ref 26.0–34.0)
MCHC: 32.2 g/dL (ref 30.0–36.0)
MCV: 86.2 fL (ref 80.0–100.0)
Monocytes Absolute: 0.6 10*3/uL (ref 0.1–1.0)
Monocytes Relative: 5 %
Neutro Abs: 12.1 10*3/uL — ABNORMAL HIGH (ref 1.7–7.7)
Neutrophils Relative %: 92 %
Platelets: 262 10*3/uL (ref 150–400)
RBC: 5.56 MIL/uL — ABNORMAL HIGH (ref 3.87–5.11)
RDW: 14.3 % (ref 11.5–15.5)
WBC: 13.1 10*3/uL — ABNORMAL HIGH (ref 4.0–10.5)
nRBC: 0 % (ref 0.0–0.2)

## 2021-07-08 LAB — COMPREHENSIVE METABOLIC PANEL
ALT: 22 U/L (ref 0–44)
AST: 29 U/L (ref 15–41)
Albumin: 4.2 g/dL (ref 3.5–5.0)
Alkaline Phosphatase: 90 U/L (ref 38–126)
Anion gap: 12 (ref 5–15)
BUN: 25 mg/dL — ABNORMAL HIGH (ref 8–23)
CO2: 24 mmol/L (ref 22–32)
Calcium: 8.9 mg/dL (ref 8.9–10.3)
Chloride: 102 mmol/L (ref 98–111)
Creatinine, Ser: 0.99 mg/dL (ref 0.44–1.00)
GFR, Estimated: >60 mL/min (ref >60)
Glucose, Bld: 176 mg/dL — ABNORMAL HIGH (ref 70–99)
Potassium: 4.3 mmol/L (ref 3.5–5.1)
Sodium: 138 mmol/L (ref 135–145)
Total Bilirubin: 1 mg/dL (ref 0.3–1.2)
Total Protein: 7.2 g/dL (ref 6.5–8.1)

## 2021-07-08 LAB — RESP PANEL BY RT-PCR (FLU A&B, COVID) ARPGX2
Influenza A by PCR: NEGATIVE
Influenza B by PCR: NEGATIVE
SARS Coronavirus 2 by RT PCR: NEGATIVE

## 2021-07-08 LAB — LIPASE, BLOOD: Lipase: 26 U/L (ref 11–51)

## 2021-07-08 MED ORDER — ONDANSETRON HCL 4 MG/2ML IJ SOLN
4.0000 mg | Freq: Once | INTRAMUSCULAR | Status: AC
  Administered 2021-07-08: 4 mg via INTRAVENOUS
  Filled 2021-07-08: qty 2

## 2021-07-08 MED ORDER — SODIUM CHLORIDE 0.9 % IV BOLUS
1000.0000 mL | Freq: Once | INTRAVENOUS | Status: AC
  Administered 2021-07-08: 1000 mL via INTRAVENOUS

## 2021-07-08 MED ORDER — ONDANSETRON 4 MG PO TBDP: 4.0000 mg | ORAL_TABLET | Freq: Three times a day (TID) | ORAL | 0 refills | Status: AC | PRN

## 2021-07-08 NOTE — ED Notes (Signed)
Pt encouraged to give urine sample. Unable to at this time. ?

## 2021-07-08 NOTE — ED Triage Notes (Signed)
Pt c/o vomiting and diarrhea since last night. Denies abdominal pain and fever. Pt also c/o boil under left breast that isn't healing.  ?

## 2021-07-08 NOTE — ED Provider Notes (Signed)
?[facility] ?Provider Note ? ? ?CSN: [id number] ?Arrival date & time: 07/08/21  0710 ? ?  ? ?History ? ?Chief Complaint  ?Patient presents with  ? Emesis  ? ? ?Natasha Rodgers is a 71 y.o. female. ? ?Pt is a 71 yo female with a hx of high cholesterol, depression, and anxiety.  She started vomiting last night with diarrhea at the same time.  She is here with her son who has similar complaints.  She said they do not eat the same food; however.  Pt denies any pain.  She feels weak.  No fevers. ? ? ?  ? ?Home Medications ?Prior to Admission medications   ?Medication Sig Start Date End Date Taking? Authorizing Provider  ?ondansetron (ZOFRAN-ODT) 4 MG disintegrating tablet Take 1 tablet (4 mg total) by mouth every 8 (eight) hours as needed for nausea or vomiting. 07/08/21  Yes Isla Pence, MD  ?aspirin EC 81 MG tablet Take 81 mg by mouth daily.      [provider]  ?buPROPion (WELLBUTRIN XL) 300 MG 24 hr tablet Take 300 mg by mouth daily.      [provider]  ?calcium-vitamin D (OSCAL WITH D) 500-200 MG-UNIT per tablet Take 2 tablets by mouth daily.     [provider]  ?citalopram (CELEXA) 40 MG tablet Take 40 mg by mouth daily.      [provider]  ?clobetasol cream (TEMOVATE) 0.05 % Use 2 x daily for 2 weeks then 2-3 x a week ?Patient taking differently: Use 2 x daily for 2 weeks then 2-3 x a week prn 03/13/15   Derrek Monaco A, NP  ?ketotifen (ZADITOR) 0.025 % ophthalmic solution 1 drop 2 (two) times daily.    [provider]  ?Multiple Vitamins-Minerals (CENTRUM SILVER ULTRA WOMENS PO) Take 1 tablet by mouth daily.     [provider]  ?Multiple Vitamins-Minerals (PRESERVISION AREDS PO) Take 1 tablet by mouth 3 (three) times daily.     [provider]  ?Omega-3 Fatty Acids (FISH OIL) 1200 MG CAPS Take by mouth. Takes 3 daily    [provider]  ?pravastatin (PRAVACHOL) 40 MG tablet Take 40 mg by mouth daily.       [provider]  ?   ? ?Allergies    ?Patient has no known allergies.   ? ?Review of Systems   ?Review of Systems  ?Gastrointestinal:  Positive for diarrhea, nausea and vomiting.  ?All other systems reviewed and are negative. ? ?Physical Exam ?Updated Vital Signs ?BP 106/62   Pulse 82   Temp 98.4 ?F (36.9 ?C) (Oral)   Resp 20   Ht '5\' 6"'$  (1.676 m)   Wt 95.3 kg   SpO2 98%   BMI 33.89 kg/m?  ?Physical Exam ?Vitals and nursing note reviewed.  ?Constitutional:   ?   Appearance: Normal appearance.  ?HENT:  ?   Head: Normocephalic and atraumatic.  ?   Right Ear: External ear normal.  ?   Left Ear: External ear normal.  ?   Nose: Nose normal.  ?   Mouth/Throat:  ?   Mouth: Mucous membranes are dry.  ?Eyes:  ?   Extraocular Movements: Extraocular movements intact.  ?   Conjunctiva/sclera: Conjunctivae normal.  ?   Pupils: Pupils are equal, round, and reactive to light.  ?Cardiovascular:  ?   Rate and Rhythm: Normal rate and regular rhythm.  ?   Pulses: Normal pulses.  ?   Heart  sounds: Normal heart sounds.  ?Pulmonary:  ?   Effort: Pulmonary effort is normal.  ?   Breath sounds: Normal breath sounds.  ?Abdominal:  ?   General: Abdomen is flat. Bowel sounds are normal.  ?   Palpations: Abdomen is soft.  ?Musculoskeletal:     ?   General: Normal range of motion.  ?   Cervical back: Normal range of motion and neck supple.  ?Skin: ?   General: Skin is warm.  ?   Capillary Refill: Capillary refill takes less than 2 seconds.  ?Neurological:  ?   General: No focal deficit present.  ?   Mental Status: She is alert and oriented to person, place, and time.  ?Psychiatric:     ?   Mood and Affect: Mood normal.     ?   Behavior: Behavior normal.  ? ? ?ED Results / Procedures / Treatments   ?Labs ?(all labs ordered are listed, but only abnormal results are displayed) ?Labs Reviewed  ?CBC WITH DIFFERENTIAL/PLATELET - Abnormal; Notable for the following components:  ?    Result Value  ? WBC 13.1 (*)   ? RBC 5.56 (*)   ?  Hemoglobin 15.4 (*)   ? HCT 47.9 (*)   ? Neutro Abs 12.1 (*)   ? Lymphs Abs 0.3 (*)   ? All other components within normal limits  ?COMPREHENSIVE METABOLIC PANEL - Abnormal; Notable for the following components:  ? Glucose, Bld 176 (*)   ? BUN 25 (*)   ? All other components within normal limits  ?URINALYSIS, ROUTINE W REFLEX MICROSCOPIC - Abnormal; Notable for the following components:  ? APPearance HAZY (*)   ? Hgb urine dipstick MODERATE (*)   ? Ketones, ur 5 (*)   ? Protein, ur 30 (*)   ? Leukocytes,Ua MODERATE (*)   ? Bacteria, UA RARE (*)   ? All other components within normal limits  ?RESP PANEL BY RT-PCR (FLU A&B, COVID) ARPGX2  ?LIPASE, BLOOD  ? ? ?EKG ?EKG Interpretation ? ?Date/Time:  Sunday July 08 2021 08:14:48 EDT ?Ventricular Rate:  94 ?PR Interval:  222 ?QRS Duration: 99 ?QT Interval:  349 ?QTC Calculation: 437 ?R Axis:   -59 ?Text Interpretation: Sinus rhythm with PACs Prolonged PR interval Left anterior fascicular block Abnormal R-wave progression, early transition LVH with secondary repolarization abnormality Anterior Q waves, possibly due to LVH No significant change since last tracing Confirmed by Isla Pence [phone]) on 07/08/2021 8:17:28 AM ? ?Radiology ?No results found. ? ?Procedures ?Procedures  ? ? ?Medications Ordered in ED ?Medications  ?sodium chloride 0.9 % bolus 1,000 mL (0 mLs Intravenous Stopped 07/08/21 0945)  ?ondansetron [facility]) injection 4 mg (4 mg Intravenous Given 07/08/21 0805)  ? ? ?ED Course/ Medical Decision Making/ A&P ?  ?                        ?Medical Decision Making ?Amount and/or Complexity of Data Reviewed ?Labs: ordered. ? ?Risk ?Prescription drug management. ? ? ?This patient presents to the ED for concern of n/v/d, this involves an extensive number of treatment options, and is a complaint that carries with it a high risk of complications and morbidity.  The differential diagnosis includes gastroenteritis, infection ? ? ?Co morbidities that complicate the  patient evaluation ? ?High cholesterol ? ? ?Additional history obtained: ? ?Additional history obtained from epic chart review ?External records from outside source obtained and reviewed including son ? ? ?Lab Tests: ? ?  I Ordered, and personally interpreted labs.  The pertinent results include:  cbc with slight elevation of wbc at 13.1, cmp with slight elevation of glucose at 176.  Covid/flu neg ? ? ?Cardiac Monitoring: ? ?The patient was maintained on a cardiac monitor.  I personally viewed and interpreted the cardiac monitored which showed an underlying rhythm of: nsr ? ? ?Medicines ordered and prescription drug management: ? ?I ordered medication including zofran and ivfs  for dehydration and n/v  ?Reevaluation of the patient after these medicines showed that the patient improved ?I have reviewed the patients home medicines and have made adjustments as needed ? ? ?Test Considered: ? ?Ct abd/pelvis; however sx have resolved  ? ? ?Critical Interventions: ? ?IVFs, zofran ? ? ? ?Problem List / ED Course: ? ?Nausea/vomiting/diarrhea/dehydration:  likely viral gastroenteritis.  Sx have now resolved.  Pt is tolerating po fluids.  She is to return if worse.  F/u with pcp. ? ? ?Reevaluation: ? ?After the interventions noted above, I reevaluated the patient and found that they have :improved ? ? ?Social Determinants of Health: ? ?Lives at home with son ? ? ?Dispostion: ? ?After consideration of the diagnostic results and the patients response to treatment, I feel that the patent would benefit from discharge with outpatient f/u.   ? ? ? ? ? ? ? ?Final Clinical Impression(s) / ED Diagnoses ?Final diagnoses:  ?Dehydration  ?Gastroenteritis  ? ? ?Rx / DC Orders ?ED Discharge Orders   ? ?      Ordered  ?  ondansetron (ZOFRAN-ODT) 4 MG disintegrating tablet  Every 8 hours PRN       ? 07/08/21 1025  ? ?  ?  ? ?  ? ? ?  ?Isla Pence, MD ?07/08/21 1028 ? ?

## 2022-01-15 ENCOUNTER — Other Ambulatory Visit (HOSPITAL_COMMUNITY): Payer: Self-pay | Admitting: Family Medicine

## 2022-01-15 DIAGNOSIS — M8580 Other specified disorders of bone density and structure, unspecified site: Secondary | ICD-10-CM

## 2022-01-15 DIAGNOSIS — Z1231 Encounter for screening mammogram for malignant neoplasm of breast: Secondary | ICD-10-CM

## 2022-02-18 ENCOUNTER — Ambulatory Visit (HOSPITAL_COMMUNITY)
Admission: RE | Admit: 2022-02-18 | Discharge: 2022-02-18 | Disposition: A | Discharge status: Home / Self Care | Payer: Medicare Other | Source: Ambulatory Visit | Attending: Family Medicine | Admitting: Family Medicine

## 2022-02-18 DIAGNOSIS — Z1231 Encounter for screening mammogram for malignant neoplasm of breast: Secondary | ICD-10-CM

## 2022-02-18 DIAGNOSIS — Z13820 Encounter for screening for osteoporosis: Secondary | ICD-10-CM | POA: Diagnosis not present

## 2022-02-18 DIAGNOSIS — M8580 Other specified disorders of bone density and structure, unspecified site: Secondary | ICD-10-CM

## 2022-02-18 DIAGNOSIS — M810 Age-related osteoporosis without current pathological fracture: Secondary | ICD-10-CM | POA: Diagnosis not present

## 2022-02-18 DIAGNOSIS — Z780 Asymptomatic menopausal state: Secondary | ICD-10-CM | POA: Insufficient documentation

## 2022-02-21 ENCOUNTER — Other Ambulatory Visit (HOSPITAL_COMMUNITY): Payer: Self-pay | Admitting: Family Medicine

## 2022-02-28 ENCOUNTER — Other Ambulatory Visit (HOSPITAL_COMMUNITY): Payer: Self-pay | Admitting: Family Medicine

## 2022-02-28 ENCOUNTER — Encounter (HOSPITAL_COMMUNITY): Payer: Self-pay | Admitting: Family Medicine

## 2022-02-28 DIAGNOSIS — R928 Other abnormal and inconclusive findings on diagnostic imaging of breast: Secondary | ICD-10-CM

## 2022-03-04 ENCOUNTER — Other Ambulatory Visit (HOSPITAL_COMMUNITY): Payer: Self-pay | Admitting: Family Medicine

## 2022-03-04 DIAGNOSIS — R928 Other abnormal and inconclusive findings on diagnostic imaging of breast: Secondary | ICD-10-CM

## 2022-03-06 ENCOUNTER — Ambulatory Visit (HOSPITAL_COMMUNITY)
Admission: RE | Admit: 2022-03-06 | Discharge: 2022-03-06 | Disposition: A | Discharge status: Home / Self Care | Payer: Medicare Other | Source: Ambulatory Visit | Attending: Family Medicine | Admitting: Family Medicine

## 2022-03-06 DIAGNOSIS — R928 Other abnormal and inconclusive findings on diagnostic imaging of breast: Secondary | ICD-10-CM | POA: Insufficient documentation

## 2022-03-10 IMAGING — MG MM DIGITAL SCREENING BILAT W/ TOMO AND CAD
6 of 10 series · 6 of 30 positions shown · non-contrast
Comparison: Previous exam(s).

CLINICAL DATA: Screening.

EXAM:
DIGITAL SCREENING BILATERAL MAMMOGRAM WITH TOMOSYNTHESIS AND CAD
TECHNIQUE: Bilateral screening digital craniocaudal and mediolateral oblique
mammograms were obtained. Bilateral screening digital breast
tomosynthesis was performed. The images were evaluated with
computer-aided detection.

[R CC synth-2D]
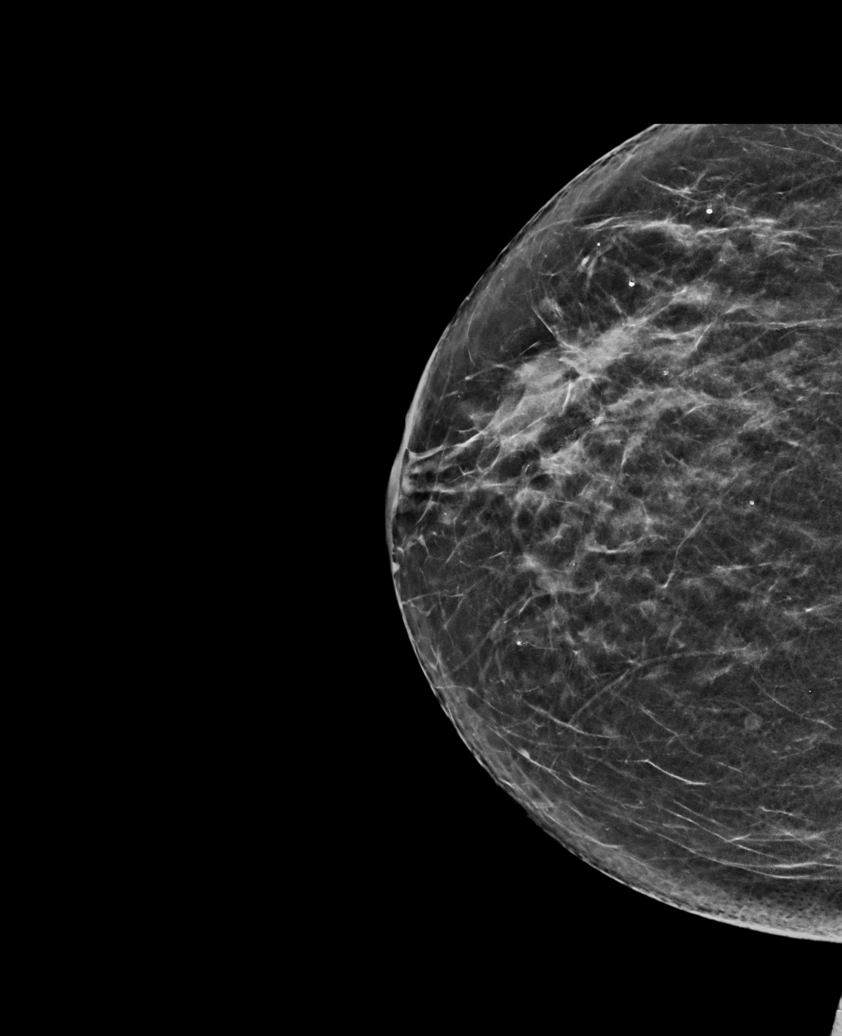

[R MLO synth-2D (1 of 2)]
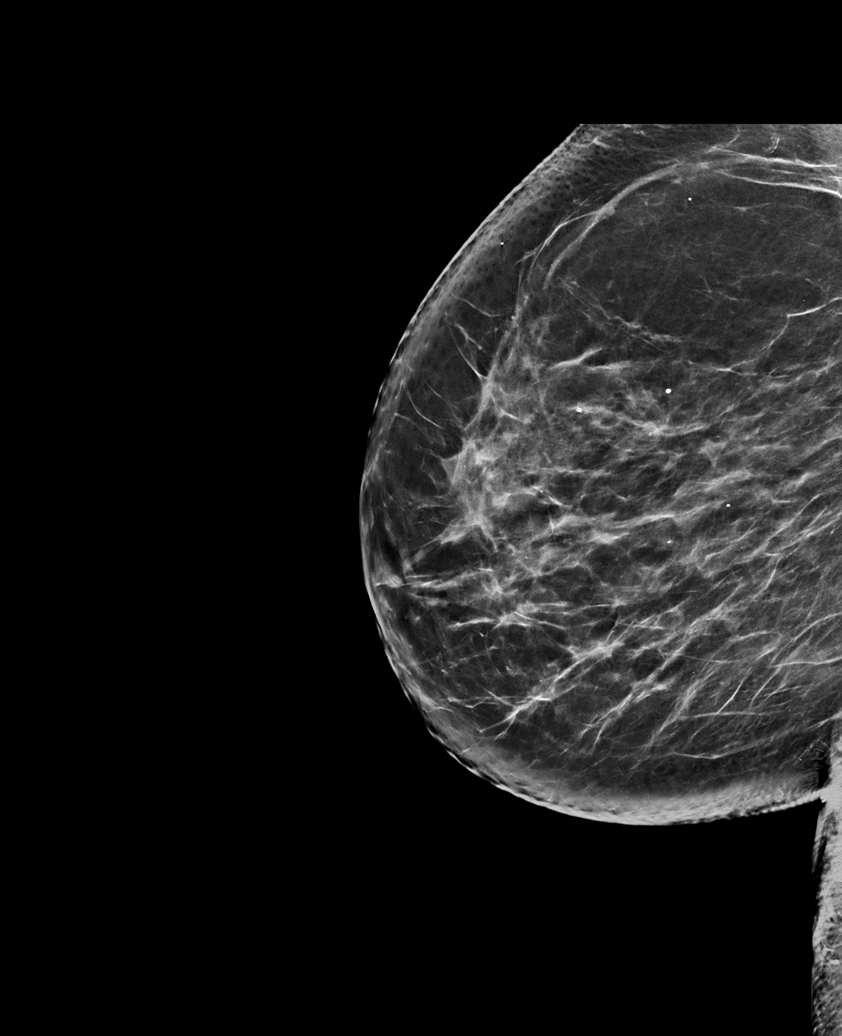

[L CC synth-2D]
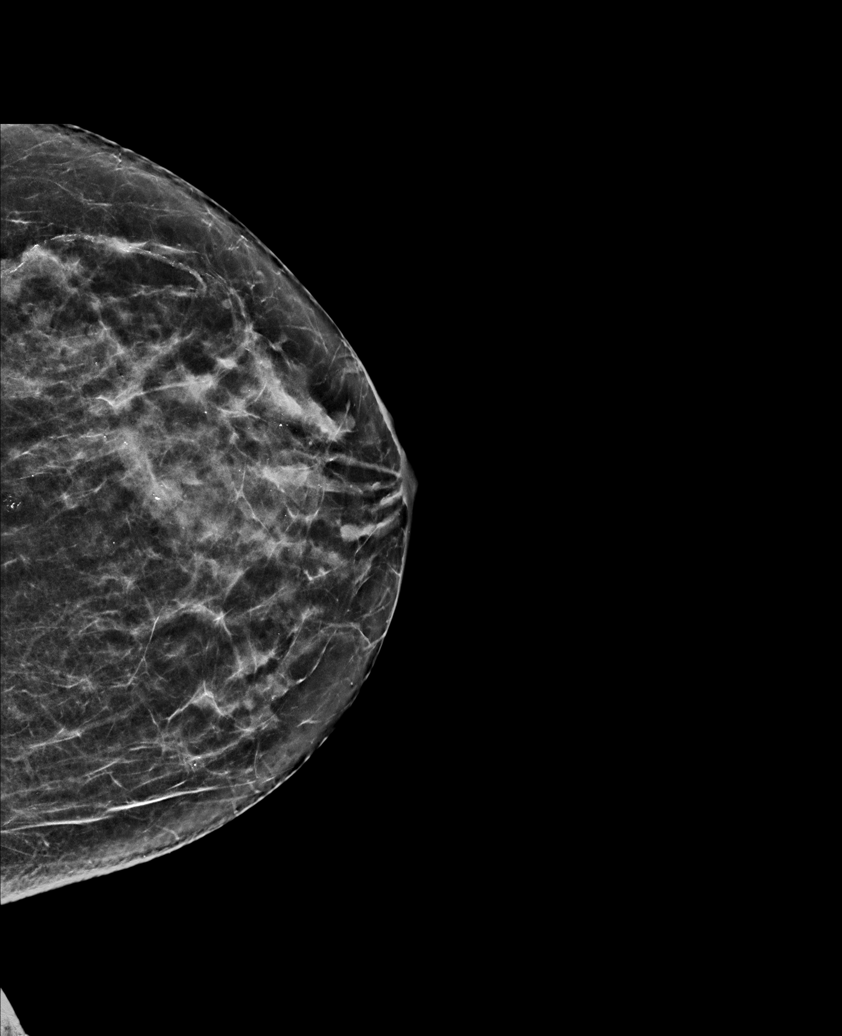

[R MLO synth-2D (2 of 2)]
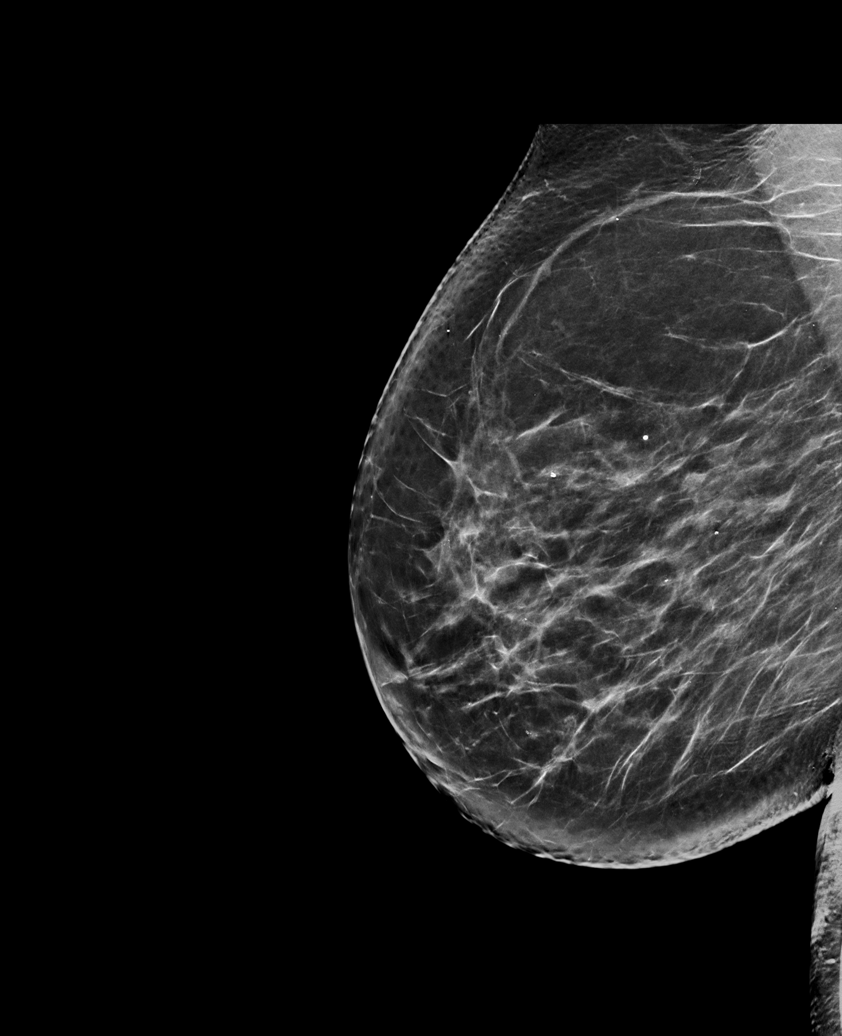

[L MLO synth-2D]
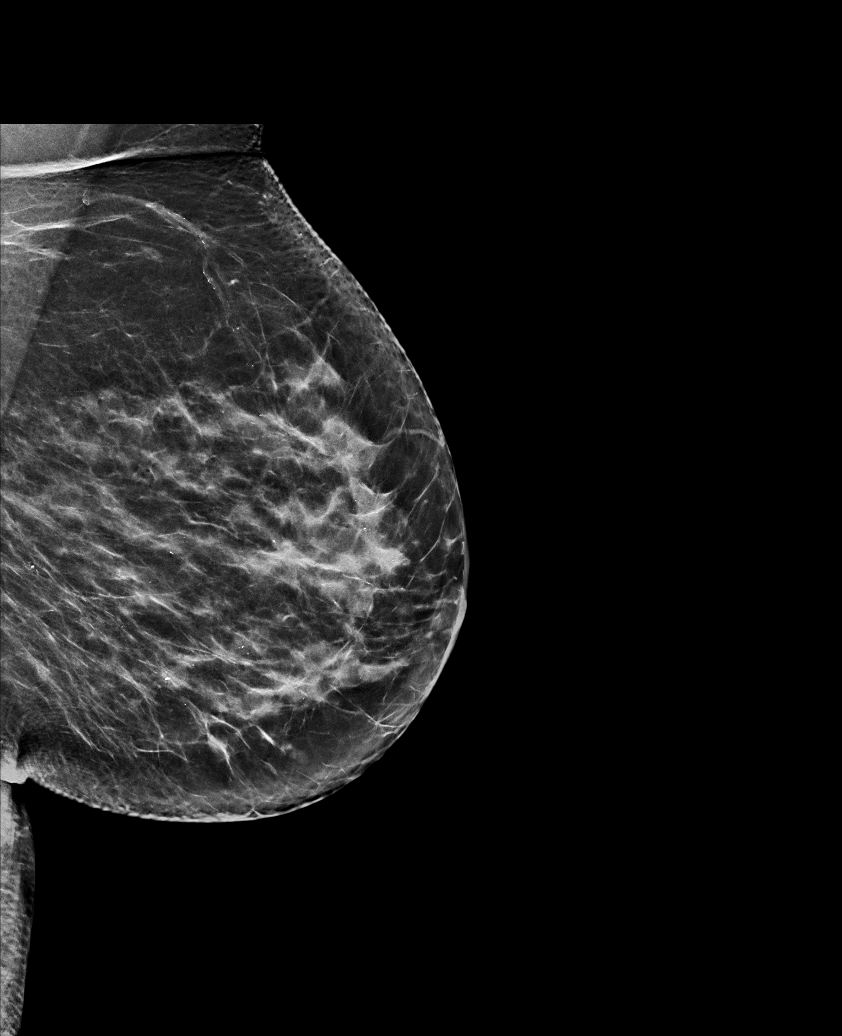

[L MLO tomo · tomo slice 37/74.0]
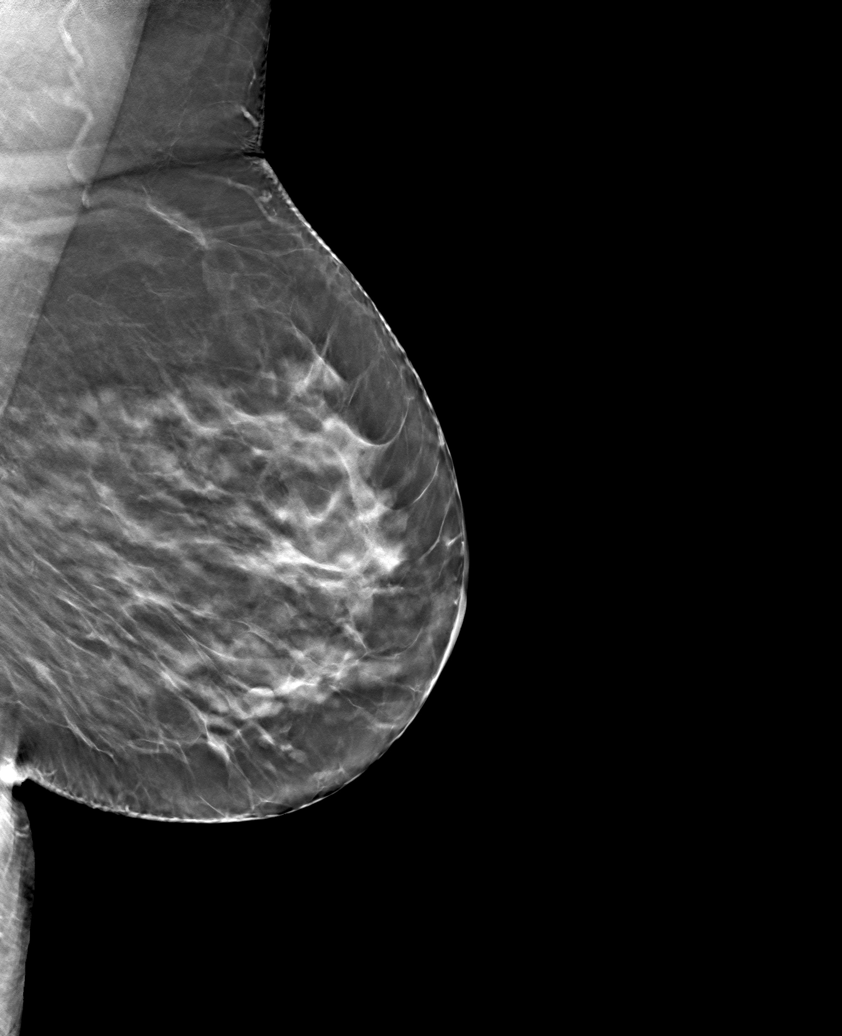

[6 of 30 positions shown; findings below may reference images not displayed]

ACR Breast Density Category c: The breast tissue is heterogeneously
dense, which may obscure small masses.
FINDINGS: There are no findings suspicious for malignancy.
IMPRESSION: No mammographic evidence of malignancy. A result letter of this
screening mammogram will be mailed directly to the patient.

RECOMMENDATION:
Screening mammogram in one year. (Code:Q3-W-BC3)

BI-RADS CATEGORY  1: Negative.

## 2022-09-09 LAB — EXTERNAL GENERIC LAB PROCEDURE: COLOGUARD: NEGATIVE

## 2022-09-09 LAB — COLOGUARD: COLOGUARD: NEGATIVE

## 2023-01-27 ENCOUNTER — Other Ambulatory Visit (HOSPITAL_COMMUNITY): Payer: Self-pay | Admitting: Family Medicine

## 2023-01-27 DIAGNOSIS — Z1231 Encounter for screening mammogram for malignant neoplasm of breast: Secondary | ICD-10-CM

## 2023-03-10 ENCOUNTER — Ambulatory Visit (HOSPITAL_COMMUNITY)
Admission: RE | Admit: 2023-03-10 | Discharge: 2023-03-10 | Disposition: A | Discharge status: Home / Self Care | Payer: Medicare Other | Source: Ambulatory Visit | Attending: Family Medicine | Admitting: Family Medicine

## 2023-03-10 DIAGNOSIS — Z1231 Encounter for screening mammogram for malignant neoplasm of breast: Secondary | ICD-10-CM | POA: Insufficient documentation

## 2024-02-09 ENCOUNTER — Other Ambulatory Visit (HOSPITAL_COMMUNITY): Payer: Self-pay | Admitting: Family Medicine

## 2024-02-09 DIAGNOSIS — Z1231 Encounter for screening mammogram for malignant neoplasm of breast: Secondary | ICD-10-CM

## 2024-03-12 ENCOUNTER — Ambulatory Visit (HOSPITAL_COMMUNITY)
Admission: RE | Admit: 2024-03-12 | Discharge: 2024-03-12 | Disposition: A | Discharge status: Home / Self Care | Source: Ambulatory Visit | Attending: Family Medicine | Admitting: Family Medicine

## 2024-03-12 DIAGNOSIS — Z1231 Encounter for screening mammogram for malignant neoplasm of breast: Secondary | ICD-10-CM | POA: Insufficient documentation

## 2024-03-18 ENCOUNTER — Other Ambulatory Visit (HOSPITAL_COMMUNITY): Payer: Self-pay | Admitting: Family Medicine

## 2024-03-18 DIAGNOSIS — R928 Other abnormal and inconclusive findings on diagnostic imaging of breast: Secondary | ICD-10-CM

## 2024-03-23 ENCOUNTER — Encounter (HOSPITAL_COMMUNITY): Payer: Self-pay

## 2024-03-23 ENCOUNTER — Ambulatory Visit (HOSPITAL_COMMUNITY)
Admission: RE | Admit: 2024-03-23 | Discharge: 2024-03-23 | Disposition: A | Discharge status: Home / Self Care | Source: Ambulatory Visit | Attending: Family Medicine | Admitting: Family Medicine

## 2024-03-23 DIAGNOSIS — R928 Other abnormal and inconclusive findings on diagnostic imaging of breast: Secondary | ICD-10-CM | POA: Insufficient documentation
